# Patient Record
Sex: Male | Born: 2000 | Race: White | Hispanic: No | Marital: Single | Smoking: Never smoker
Health system: Southern US, Academic
[De-identification: ages and names within clinical notes are randomized; demographics above are authoritative.]

---
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
## 2022-06-07 ENCOUNTER — Encounter (HOSPITAL_COMMUNITY): Payer: Self-pay

## 2022-06-07 ENCOUNTER — Inpatient Hospital Stay (HOSPITAL_COMMUNITY): Admit: 2022-06-07 | Payer: BLUE CROSS/BLUE SHIELD | Admitting: Podiatrist

## 2022-06-07 SURGERY — AMPUTATION TOE
Site: Toe | Laterality: Right

## 2022-06-18 ENCOUNTER — Ambulatory Visit (HOSPITAL_COMMUNITY): Payer: BLUE CROSS/BLUE SHIELD

## 2022-06-18 ENCOUNTER — Encounter (HOSPITAL_COMMUNITY)
Admission: RE | Disposition: A | Discharge status: Home / Self Care | Payer: Self-pay | Source: Ambulatory Visit | Attending: Podiatrist

## 2022-06-18 ENCOUNTER — Other Ambulatory Visit: Payer: Self-pay

## 2022-06-18 ENCOUNTER — Encounter (HOSPITAL_COMMUNITY): Payer: Self-pay | Admitting: Podiatrist

## 2022-06-18 ENCOUNTER — Inpatient Hospital Stay
Admission: RE | Admit: 2022-06-18 | Discharge: 2022-06-18 | Disposition: A | Discharge status: Home / Self Care | Payer: BLUE CROSS/BLUE SHIELD | Source: Ambulatory Visit | Attending: Podiatrist | Admitting: Podiatrist

## 2022-06-18 DIAGNOSIS — Q699 Polydactyly, unspecified: Secondary | ICD-10-CM

## 2022-06-18 DIAGNOSIS — Q692 Accessory toe(s): Secondary | ICD-10-CM | POA: Insufficient documentation

## 2022-06-18 SURGERY — AMPUTATION TOE
Anesthesia: General | Site: Toe | Laterality: Right | Wound class: Clean Wound: Uninfected operative wounds in which no inflammation occurred

## 2022-06-18 MED ORDER — LIDOCAINE HCL 10 MG/ML (1 %) INJECTION SOLUTION
Freq: Once | INTRAMUSCULAR | Status: DC | PRN
Start: 2022-06-18 — End: 2022-06-18
  Administered 2022-06-18: 5 mL via INTRAMUSCULAR

## 2022-06-18 MED ORDER — OXYCODONE-ACETAMINOPHEN 5 MG-325 MG TABLET
1.0000 | ORAL_TABLET | ORAL | Status: DC | PRN
Start: 2022-06-18 — End: 2022-06-18
  Administered 2022-06-18: 1 via ORAL
  Filled 2022-06-18: qty 1

## 2022-06-18 MED ORDER — DEXAMETHASONE SODIUM PHOSPHATE 4 MG/ML INJECTION SOLUTION
INTRAMUSCULAR | Status: AC
Start: 2022-06-18 — End: 2022-06-18
  Filled 2022-06-18: qty 1

## 2022-06-18 MED ORDER — EPHEDRINE SULFATE 50 MG/ML INTRAVENOUS SOLUTION
Freq: Once | INTRAVENOUS | Status: DC | PRN
Start: 2022-06-18 — End: 2022-06-18
  Administered 2022-06-18: 10 mg via INTRAVENOUS

## 2022-06-18 MED ORDER — FENTANYL (PF) 50 MCG/ML INJECTION WRAPPER
INJECTION | Freq: Once | INTRAMUSCULAR | Status: DC | PRN
Start: 2022-06-18 — End: 2022-06-18
  Administered 2022-06-18 (×2): 50 ug via INTRAVENOUS

## 2022-06-18 MED ORDER — IPRATROPIUM 0.5 MG-ALBUTEROL 3 MG (2.5 MG BASE)/3 ML NEBULIZATION SOLN
3.0000 mL | INHALATION_SOLUTION | Freq: Once | RESPIRATORY_TRACT | Status: DC | PRN
Start: 2022-06-18 — End: 2022-06-18

## 2022-06-18 MED ORDER — CEFAZOLIN 1 GRAM SOLUTION FOR INJECTION
Freq: Once | INTRAMUSCULAR | Status: DC | PRN
Start: 2022-06-18 — End: 2022-06-18
  Administered 2022-06-18: 2000 mg via INTRAVENOUS

## 2022-06-18 MED ORDER — FENTANYL (PF) 50 MCG/ML INJECTION WRAPPER
12.5000 ug | INJECTION | INTRAMUSCULAR | Status: DC | PRN
Start: 2022-06-18 — End: 2022-06-18

## 2022-06-18 MED ORDER — LIDOCAINE (PF) 100 MG/5 ML (2 %) INTRAVENOUS SYRINGE
INJECTION | Freq: Once | INTRAVENOUS | Status: DC | PRN
Start: 2022-06-18 — End: 2022-06-18
  Administered 2022-06-18: 60 mg via INTRAVENOUS

## 2022-06-18 MED ORDER — FAMOTIDINE (PF) 20 MG/2 ML INTRAVENOUS SOLUTION
20.0000 mg | Freq: Once | INTRAVENOUS | Status: AC
Start: 2022-06-18 — End: 2022-06-18
  Administered 2022-06-18: 20 mg via INTRAVENOUS

## 2022-06-18 MED ORDER — SODIUM CHLORIDE 0.9 % (FLUSH) INJECTION SYRINGE
3.0000 mL | INJECTION | INTRAMUSCULAR | Status: DC | PRN
Start: 2022-06-18 — End: 2022-06-18

## 2022-06-18 MED ORDER — FENTANYL (PF) 50 MCG/ML INJECTION WRAPPER
25.0000 ug | INJECTION | INTRAMUSCULAR | Status: DC | PRN
Start: 2022-06-18 — End: 2022-06-18

## 2022-06-18 MED ORDER — LACTATED RINGERS INTRAVENOUS SOLUTION
INTRAVENOUS | Status: DC
Start: 2022-06-18 — End: 2022-06-18

## 2022-06-18 MED ORDER — FENTANYL (PF) 50 MCG/ML INJECTION WRAPPER
50.0000 ug | INJECTION | INTRAMUSCULAR | Status: DC | PRN
Start: 2022-06-18 — End: 2022-06-18

## 2022-06-18 MED ORDER — LIDOCAINE HCL 10 MG/ML (1 %) INJECTION SOLUTION
INTRAMUSCULAR | Status: AC
Start: 2022-06-18 — End: 2022-06-18
  Filled 2022-06-18: qty 20

## 2022-06-18 MED ORDER — MIDAZOLAM 5 MG/ML INJECTION WRAPPER
2.5000 mg | Freq: Once | INTRAMUSCULAR | Status: DC | PRN
Start: 2022-06-18 — End: 2022-06-18
  Administered 2022-06-18: 2.5 mg via INTRAVENOUS

## 2022-06-18 MED ORDER — SODIUM CHLORIDE 0.9 % (FLUSH) INJECTION SYRINGE
3.0000 mL | INJECTION | Freq: Three times a day (TID) | INTRAMUSCULAR | Status: DC
Start: 2022-06-18 — End: 2022-06-18

## 2022-06-18 MED ORDER — ROPIVACAINE (PF) 2 MG/ML (0.2 %) INJECTION SOLUTION
Freq: Once | INTRAMUSCULAR | Status: DC | PRN
Start: 2022-06-18 — End: 2022-06-18
  Administered 2022-06-18: 5 mL via INTRAMUSCULAR

## 2022-06-18 MED ORDER — SODIUM CHLORIDE 0.9 % INTRAVENOUS PIGGYBACK
INJECTION | INTRAVENOUS | Status: AC
Start: 2022-06-18 — End: 2022-06-18
  Filled 2022-06-18: qty 100

## 2022-06-18 MED ORDER — FAMOTIDINE (PF) 20 MG/2 ML INTRAVENOUS SOLUTION
INTRAVENOUS | Status: AC
Start: 2022-06-18 — End: 2022-06-18
  Filled 2022-06-18: qty 2

## 2022-06-18 MED ORDER — ROPIVACAINE (PF) 2 MG/ML (0.2 %) INJECTION SOLUTION
INTRAMUSCULAR | Status: AC
Start: 2022-06-18 — End: 2022-06-18
  Filled 2022-06-18: qty 10

## 2022-06-18 MED ORDER — HYDROMORPHONE 2 MG/ML INJECTION WRAPPER
0.5000 mg | INJECTION | INTRAMUSCULAR | Status: DC | PRN
Start: 2022-06-18 — End: 2022-06-18

## 2022-06-18 MED ORDER — MIDAZOLAM 5 MG/ML INJECTION WRAPPER
INTRAMUSCULAR | Status: AC
Start: 2022-06-18 — End: 2022-06-18
  Filled 2022-06-18: qty 1

## 2022-06-18 MED ORDER — DEXAMETHASONE SODIUM PHOSPHATE 4 MG/ML INJECTION SOLUTION
4.0000 mg | Freq: Once | INTRAMUSCULAR | Status: AC
Start: 2022-06-18 — End: 2022-06-18
  Administered 2022-06-18: 4 mg via INTRAVENOUS

## 2022-06-18 MED ORDER — PROPOFOL 10 MG/ML IV BOLUS
INJECTION | Freq: Once | INTRAVENOUS | Status: DC | PRN
Start: 2022-06-18 — End: 2022-06-18
  Administered 2022-06-18: 200 mg via INTRAVENOUS

## 2022-06-18 MED ORDER — ALBUTEROL SULFATE 2.5 MG/3 ML (0.083 %) SOLUTION FOR NEBULIZATION
2.5000 mg | INHALATION_SOLUTION | Freq: Once | RESPIRATORY_TRACT | Status: DC | PRN
Start: 2022-06-18 — End: 2022-06-18

## 2022-06-18 MED ORDER — ONDANSETRON HCL (PF) 4 MG/2 ML INJECTION SOLUTION
4.0000 mg | Freq: Once | INTRAMUSCULAR | Status: DC | PRN
Start: 2022-06-18 — End: 2022-06-18

## 2022-06-18 MED ORDER — HYDROMORPHONE 2 MG/ML INJECTION WRAPPER
0.2500 mg | INJECTION | INTRAMUSCULAR | Status: DC | PRN
Start: 2022-06-18 — End: 2022-06-18

## 2022-06-18 MED ORDER — ONDANSETRON HCL (PF) 4 MG/2 ML INJECTION SOLUTION
4.0000 mg | Freq: Once | INTRAMUSCULAR | Status: AC
Start: 2022-06-18 — End: 2022-06-18
  Administered 2022-06-18: 4 mg via INTRAVENOUS

## 2022-06-18 MED ORDER — ONDANSETRON HCL (PF) 4 MG/2 ML INJECTION SOLUTION
INTRAMUSCULAR | Status: AC
Start: 2022-06-18 — End: 2022-06-18
  Filled 2022-06-18: qty 2

## 2022-06-18 MED ORDER — CEFAZOLIN 1 GRAM SOLUTION FOR INJECTION
INTRAMUSCULAR | Status: AC
Start: 2022-06-18 — End: 2022-06-18
  Filled 2022-06-18: qty 20

## 2022-06-18 MED ORDER — FENTANYL (PF) 50 MCG/ML INJECTION SOLUTION
INTRAMUSCULAR | Status: AC
Start: 2022-06-18 — End: 2022-06-18
  Filled 2022-06-18: qty 2

## 2022-06-18 MED ORDER — HALOPERIDOL LACTATE 5 MG/ML INJECTION SOLUTION
1.0000 mg | Freq: Once | INTRAMUSCULAR | Status: DC | PRN
Start: 2022-06-18 — End: 2022-06-18

## 2022-06-18 SURGICAL SUPPLY — 77 items
BAG SUT DVN STRL LF (SUTURE/WOUND CLOSURE) ×1 IMPLANT
BAG SUTURE DEVON STERILE LATEX FREE (SUTURE/WOUND CLOSURE) ×1
BANDAGE 3.6YDX3.4IN 6 PLY HYPOALL LOFT LIGHT STRCH COTTON GAUZE STRL LF  DISP (WOUND CARE SUPPLY) ×1 IMPLANT
BANDAGE COFLX 5YDX4IN NONST CHSV SLF ADH FOAM COMPRESS TAN LF (WOUND CARE SUPPLY) ×1 IMPLANT
BANDAGE COFLX 5YDX4IN NONST CH_SV SLF ADH FOAM COMPRESS TAN (WOUND CARE/ENTEROSTOMAL SUPPLY) ×1
BANDAGE ESMARK NV+ 9FTX3IN STRL COMPRESS LF  DISP (WOUND CARE SUPPLY) ×1 IMPLANT
BANDAGE PRESSURE 9X3FT NOVAPLUS ESMK V1831 9 20/CS NON-LATEX (WOUND CARE/ENTEROSTOMAL SUPPLY) ×1
BANDAGE SFFRM 75X4IN STRCH FNSH EDGE ABS PLSTR RYN GAUZE (WOUND CARE/ENTEROSTOMAL SUPPLY) ×1
BANDAGE SFFRM 75X4IN STRCH FNSH EDGE ABS PLSTR RYN GAUZE STRL LF  DISP (WOUND CARE SUPPLY) ×1 IMPLANT
BLADE 10 2 END CBNSTL SURG STRL DISP (CUTTING ELEMENTS) ×1
BLADE 10 2 END CBNSTL SURG STRL DISP (SURGICAL CUTTING SUPPLIES) ×1 IMPLANT
BLADE 15 2 END CBNSTL SURG STRL DISP (CUTTING ELEMENTS) ×2
BLADE 15 2 END CBNSTL SURG STRL DISP (SURGICAL CUTTING SUPPLIES) ×2 IMPLANT
CLEANER INSTR PREPZYME MUL-TRD CONTAINR NARSL NEUT PH BDGR (MISCELLANEOUS PT CARE ITEMS) ×1
CONTAINER SP CLIKSEAL 120CC_01063 STRL 300EA/CS (MISCELLANEOUS PT CARE ITEMS) ×1
CONTAINR CLICKSEAL 4OZ TRANSLUC SCREW CAP STRL BLU SPECI PNEUM TUBE SYS (MISCELLANEOUS PT CARE ITEMS) ×1 IMPLANT
CONV USE 123874 - SYRINGE AMSURE MDCHC 60CC LF  STRL TIP PRTC SM TUBE ADPR IRRG DISC BULB POLYPROP (MED SURG SUPPLIES) ×1 IMPLANT
CONV USE 31829 - NEEDLE HYPO  18GA 1.5IN STD REG BVL LF (MED SURG SUPPLIES) ×2 IMPLANT
CONV USE ITEM 321837 - GLOVE SURG 7.5 LTX PF NONST CRM (GLOVES AND ACCESSORIES) ×1 IMPLANT
CONV USE ITEM 321983 - GLOVE SURG 8 LTX CHEMO PF SMOOTH BEAD CUF STRL WHT 11.6IN PLMR THK.2MM THK.21MM (GLOVES AND ACCESSORIES) ×1 IMPLANT
CONV USE ITEM 322852 - ROLL ECODRI-SAFE ABS POLY BCK ORTHO (ORTHOPEDICS (NOT IMPLANTS)) ×1 IMPLANT
CONV USE ITEM 323185 - PAD EG 15SQ IN UNIV FOAM SPLT NONCORD ADULT 9100 SER (SURGICAL CUTTING SUPPLIES) ×1 IMPLANT
CONV USE ITEM 329146 - CLEANER INSTR PREPZYME MUL-TRD CONTAINR NARSL NEUT PH BDGR 22OZ (MISCELLANEOUS PT CARE ITEMS) ×1 IMPLANT
CONV USE ITEM 338662 - PACK SURG ASCP STRL DISP ~~LOC~~ BPT MED CNTR LF (CUSTOM TRAYS & PACK) ×1 IMPLANT
CONV USE ITEM 34153 - ELECTRODE ESURG BLADE PNCL 3/32IN STRL SS CAUT PSHBTN STD SHAFT LF  VEGA SER (SURGICAL CUTTING SUPPLIES) ×1 IMPLANT
CONV USE ITEM 343591 - SOLIDIFY FLUID 1500CC NONST LF  PREM SOLIDIFY + (MED SURG SUPPLIES) ×1 IMPLANT
CONV USE ITEM 46133 - SUTURE 4-0 C-13 MONOSOF 18IN BLK MONOF NONAB (SUTURE/WOUND CLOSURE) ×2 IMPLANT
COUNTER 20 CNT BLOCK ADH NEEDLE STRL LF  RD SHARP FOAM 15.75X11.5X14IN DISP (MED SURG SUPPLIES) ×1 IMPLANT
COUNTER 20 CNT BLOCK ADH NEEDLE STRL LF RD SHARP FOAM 15.75 (MED SURG SUPPLIES) ×1
COVER TBL 90X50IN STD SMS REINF FNFLD STRL LF  DISP (DRAPE/PACKS/SHEETS/OR TOWEL) ×2 IMPLANT
COVER TBL 90X50IN STD SMS REINF FNFLD STRL LF DISP (DRAPE/PACKS/SHEETS/OR TOWEL) ×2
DRESS PETRO 9X5IN CURAD XR COTTON NONADH OCL IMPREGNATE LF  STRL WHT (WOUND CARE SUPPLY) ×1 IMPLANT
DRESS PETRO 9X5IN CURAD XR COT_TON NONADH OCL IMPREGNATE LF (WOUND CARE/ENTEROSTOMAL SUPPLY) ×1
DURAPREP 26ML 8630 CS/20 (MED SURG SUPPLIES) ×1
ELECTRODE ESURG BLADE PNCL 3/32IN STRL SS CAUT PSHBTN STD (CUTTING ELEMENTS) ×1
GAUZE BND ROLL 3.4IN X 3.6 YDS_MEDC (WOUND CARE/ENTEROSTOMAL SUPPLY) ×1
GLOVE SURG 7 LF  PF STRL PLISPRN DISP (GLOVES AND ACCESSORIES) ×2 IMPLANT
GLOVE SURG 7 LF PF SMOOTH STRL WHT PLISPRN (GLOVES AND ACCESSORIES) ×2
GLOVE SURG 7.5 LTX PF SMOOTH STRL CRM (GLOVES AND ACCESSORIES) ×1
GLOVE SURG 8 LTX PF SMOOTH STRL CRM (GLOVES AND ACCESSORIES) ×1
GOWN SURG LRG STD LGTH REG L3 NONREINFORCE BRTHBL TWL STRL (DRAPE/PACKS/SHEETS/OR TOWEL) ×1
GOWN SURG LRG STD LGTH REG L3 NONREINFORCE BRTHBL TWL STRL LF  DISP BLU HALYARD SPECTRUM SMS (DRAPE/PACKS/SHEETS/OR TOWEL) ×1 IMPLANT
GOWN SURG XL STD LGTH L3 NONREINFORCE HKLP CLSR TWL STRL LF (DRAPE/PACKS/SHEETS/OR TOWEL) ×1
GOWN SURG XL STD LGTH L3 NONREINFORCE HKLP CLSR TWL STRL LF  DISP BLU SPECTRUM SMS (DRAPE/PACKS/SHEETS/OR TOWEL) ×1
GOWN SURG XL STD LGTH L3 NONREINFORCE HKLP CLSR TWL STRL LF DISP BLU SPECTRUM SMS (DRAPE/PACKS/SHEETS/OR TOWEL) ×1 IMPLANT
HDPE THK22 UM C40-45 GL L48 IN X W40 IN NATURAL (MISCELLANEOUS PT CARE ITEMS) ×2 IMPLANT
NEEDLE HYPO  25GA 1.25IN STD MONOJECT AL SS REG BVL LL HUB UL SHRP ANTICORE RD STRL LF  DISP (MED SURG SUPPLIES) ×2 IMPLANT
NEEDLE HYPO 18GA 1.5IN STD RE_G BVL LF (MED SURG SUPPLIES) ×2
NEEDLE HYPO 25GA 1.25IN STD M_ONOJECT AL SS REG BVL LL HUB (MED SURG SUPPLIES) ×2
PACK ARTHROGRAM MDLN INDUSTRIES INC. RAD GEN N/M S DISP (CUSTOM TRAYS & PACK) ×1
PACK SURG ASCP STRL DISP ~~LOC~~ BPT MED CNTR LF (CUSTOM TRAYS & PACK) ×1
PAD EG 15SQ IN UNIV FOAM SPLT NONCORD ADULT 9100 SER (CUTTING ELEMENTS) ×1
ROLL ECODRI-SAFE ABS POLY BCK ORTHO (ORTHOPEDICS (NOT IMPLANTS)) ×1
SOL IRRG 0.9% NACL 1000ML PLASTIC PR BTL ISTNC N-PYRG STRL LF (MEDICATIONS/SOLUTIONS) ×1 IMPLANT
SOL SURG PREP 26ML DRPRP 74% ISPRP 0.7% IOD POVACRYLEX SLF CNTN APPL SKIN STRL PREOP (MED SURG SUPPLIES) ×1 IMPLANT
SOLIDIFY FLUID 1500CC NONST LF  PREM SOLIDIFY + (MED SURG SUPPLIES) ×1
SOLIDIFY FLUID 1500CC NONST LF PREM SOLIDIFY + (MED SURG SUPPLIES) ×1
SOLUTION IRRG NS 2F7124 1000CC_12/CS (MEDICATIONS/SOLUTIONS) ×1
SPONGE GAUZE 4X4IN MDCHC COTTON 12 PLY TY 7 LF  STRL DISP (WOUND CARE SUPPLY) ×2 IMPLANT
SPONGE GAUZE 4X4IN MDCHC COTTO_N 12 PLY TY 7 LF STRL DISP (WOUND CARE/ENTEROSTOMAL SUPPLY) ×2
STKNT ORTHO 48X4IN COTTON PLSTR 1 PLY PCUT SEWN END LF  TUB STRL OFF WHT (ORTHOPEDICS (NOT IMPLANTS)) ×1 IMPLANT
STKNT ORTHO 48X4IN COTTON PLSTR 1 PLY PCUT SEWN END LF TUB (ORTHOPEDICS (NOT IMPLANTS)) ×1
SUTURE 3-0 V-20 POLYSORB 30IN VIOL BRD COAT ABS (SUTURE/WOUND CLOSURE) ×1 IMPLANT
SUTURE 3-0 V-20 POLYSRB 30IN VIOL BRD COAT ABS (SUTURE/WOUND CLOSURE) ×2
SUTURE 4-0 C-13 MONOSOF 18IN BLK MONOF NONAB (SUTURE/WOUND CLOSURE) ×2
SYRINGE AMSURE MDCHC 60CC LF  STRL TIP PRTC SM TUBE ADPR IRRG DISC BULB POLYPROP (MED SURG SUPPLIES) ×1
SYRINGE AMSURE MDCHC 60CC LF STRL TIP PRTC SM TUBE ADPR (MED SURG SUPPLIES) ×1
SYRINGE LL 10ML LF  STRL GRAD N-PYRG DEHP-FR PVC FREE MED DISP (MED SURG SUPPLIES) ×3 IMPLANT
SYRINGE LL 10ML LF STRL MED D_ISP (MED SURG SUPPLIES) ×3
TAPE DURAPORE 2IN 6/BX 1538-2 (WOUND CARE SUPPLY) ×1 IMPLANT
TAPE DURAPORE 2IN 6/BX 1538-2 (WOUND CARE/ENTEROSTOMAL SUPPLY) ×1
TOWEL 24X16IN COTTON BLU DISP SURG STRL LF (DRAPE/PACKS/SHEETS/OR TOWEL) ×4 IMPLANT
WATER STRL 1000ML PRSV FR N-PYRG DEHP-FR PLASTIC PR BTL AQLT (MED SURG SUPPLIES) ×1
WATER STRL 1000ML PRSV FR N-PYRG DEHP-FR PLASTIC PR BTL AQLT LF (MED SURG SUPPLIES) ×1 IMPLANT
WIPE PREP PLMR ADH NONSTING DRESS ADAPT OSTOMY (MED SURG SUPPLIES) ×2 IMPLANT
WOUND IRRG IRRISEPT DBRD CLNSG 0.05% CHG SYSTEM STRL LF (WOUND CARE SUPPLY) ×1 IMPLANT
WOUND IRRG IRRISEPT DBRD CLNSG_0.05% CHG SYSTEM STRL LF (WOUND CARE/ENTEROSTOMAL SUPPLY) ×1

## 2022-06-18 NOTE — Anesthesia Preprocedure Evaluation (Signed)
ANESTHESIA PRE-OP EVALUATION  Planned Procedure: RIGHT FIFTH TOE AMPUTATION (Right: Toe)  Review of Systems     anesthesia history negative     patient summary reviewed  nursing notes reviewed        Pulmonary  negative pulmonary ROS,    Cardiovascular  negative cardio ROS,    Exercise Tolerance: > or = 4 METS        GI/Hepatic/Renal   negative GI/hepatic/renal ROS,         Endo/Other         Neuro/Psych/MS   negative neuro/psych ROS,      Cancer    negative hematology/oncology ROS,                   Physical Assessment      Airway       Mallampati: III      Mouth Opening: fair.            Dental                    Pulmonary           Cardiovascular             Other findings            Plan  ASA 2     Planned anesthesia type: general     general anesthesia with laryngeal mask airway                        Anesthetic plan and risks discussed with patient             Patient's NPO status is appropriate for Anesthesia.

## 2022-06-18 NOTE — H&P (Signed)
[facility]  H&P Update Form    Manuel Henry, Manuel Henry, 21 y.o. male  Encounter Start Date:  06/18/2022  Inpatient Admission Date:   Date of Birth:  10-26-2001    06/18/2022    STOP: IF H&P IS GREATER THAN 30 DAYS FROM SURGICAL DAY COMPLETE NEW H&P IS REQUIRED.     H & P updated the day of the procedure.  1.  H&P completed within 30 days of surgical procedure by pt's PCP  and has been reviewed within 24 hours of the surgery, the patient has been examined, and no change has occured in the patients condition since the H&P was completed.       Change in medications: No                Comments:     2.  Patient continues to be appropiate candidate for planned surgical procedure. YES      Joan Mayans, DPM

## 2022-06-18 NOTE — OR PostOp (Signed)
Patient's IV had came out at 1435. Dr. Luisa Hart in recovery room at this time and notified; verbalizes may leave IV out.

## 2022-06-18 NOTE — Anesthesia Transfer of Care (Signed)
ANESTHESIA TRANSFER OF CARE   Manuel Henry is a 21 y.o. ,male, Weight: 127 kg (280 lb)   had Procedure(s):  RIGHT FIFTH TOE AMPUTATION  performed  06/18/22   Primary Service: Joan Mayans, DPM    History reviewed. No pertinent past medical history.   Allergy History as of 06/18/22      No Known Allergies              I completed my transfer of care / handoff to the receiving personnel during which we discussed:  Access, Airway, All key/critical aspects of case discussed, Analgesia, Antibiotics, Expectation of post procedure, Fluids/Product, Gave opportunity for questions and acknowledgement of understanding, Labs and PMHx      Post Location: PACU                                                           Last OR Temp: Temperature: 36.1 C (97 F)  ABG:   Airway:* No LDAs found *  Blood pressure 111/72, pulse 72, temperature 36.1 C (97 F), resp. rate 13, height 1.88 m (6\' 2" ), weight 127 kg (280 lb), SpO2 100 %.

## 2022-06-18 NOTE — OR Surgeon (Signed)
[facility]    OPERATIVE NOTE    Patient Name: Manuel, Henry [facility] MRN:: [id number]  Date of Birth: 2001-04-23  Date of Service: 06/18/2022     Pre-Operative Diagnosis: POLYDACTYL RIGHT FOOT     Post-Operative Diagnosis: POLYDACTYL RIGHT FOOT    Procedure(s)/Description:  RIGHT FIFTH TOE AMPUTATION: 28820 (CPT)     Attending Surgeon: Leland Her, DPM     Anesthesia Staff:  Anesthesiologist: Cornett, Exie Parody, MD  CRNA: Darcel Smalling, CRNA; Judyann Munson, CRNA    Anesthesia Type: .General     Estimated Blood Loss:  Minimal    Specimens Removed:   ID Type Source Tests Collected by Time Destination   1 : EXTRA RIGHT 5TH TOE Tissue Toe SURGICAL PATHOLOGY SPECIMEN Leland Her, [place] 06/18/2022 1406       Order Name Source Comment Collection Info Order Time   SURGICAL PATHOLOGY SPECIMEN Toe Pre-op diagnosis:  POLYDACTYL RIGHT FOOT Collected By: Leland Her, DPM 06/18/2022  2:07 PM     Release to patient   Automated             Complications:  None    Condition:  Stable    Disposition:   PACU - hemodynamically stable.    Indications:  Patient is a 21 y.o. 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    Description of Procedure/Operation:  The patient was brought to the operating suite, placed supine upon the operating room table where anesthesia services provided IV sedation followed by MAC anesthesia.  The patient's RLE was then prepped and draped in usual sterile fashion in anticipation of surgery noted above.   Attention was then directed to the lateral aspect of right foot where 2 semi elliptical converging incisions were made at the base of the accessory right 5th toe.  Incisions were deepened through skin and subcutaneous tissue until the extra right 5th toe was then disarticulated from its extra joint.  This toe was then removed from surgical field and passed off as specimen for pathology.  The bony prominence noted in this location at  the head of the right 5th metatarsal was then transected and removed from surgical field as well.  Surgical site was then irrigated with copious amounts of IrriSept saline.  Capsular structures were then repaired utilizing a 3-0 Vicryl in simple suturing technique.  Subcutaneous tissue was then reapproximated utilizing a 3-0 Vicryl in simple suturing technique.  Skin was then reapproximated after being remodeled with a 4-0 nylon in simple suturing technique.  Tourniquet was then deflated and a prompt hyperemic response was then noted to the right foot and remaining digits of the right foot.  Dry sterile dressing was then applied.  The patient tolerated the procedure well and was returned to the postanesthesia care unit in stable condition after extubation in the operating suite.  Prior to closure all sponge, instrument, and needle counts were correct with excellent wound hemostasis.  Leland Her, DPM

## 2022-06-18 NOTE — OR PostOp (Signed)
Dr. Ivette Loyal at bedside speaking to patient. (1) prescription given and placed on chart.

## 2022-06-18 NOTE — Discharge Instructions (Addendum)
FOLLOW INSTRUCTIONS GIVEN BY DR. Ivette Loyal AND RETURN AS INSTRUCTED FOR DRESSING CHANGE    CALL IF INCREASED PAIN, FOUL SMELLING DRAINAGE OR A FEVER OF 100.4 OR GREATER.     USE CRUTCHES FOR BALANCE, WEAR YOUR BROWN SHOE WHEN UP WALKING    USE ICE PACK 20 MINUTES EVERY HOUR FOR 24 HOURS.    CALL THE OFFICE IF ANY QUESTIONS OR CONCERNS.     1 RX PERCOCET, TAKE AS DIRECTED    SURGICAL DISCHARGE INSTRUCTIONS     Dr. Joan Mayans, DPM  performed your RIGHT FIFTH TOE AMPUTATION today at the [facility]    [place]  [facility]:  Monday through Friday from 8 a.m. - 4 p.m.: [phone]    For T&D: [phone]  Between 4 p.m. - 8 a.m., weekends and holidays:  Call ER [phone]    PLEASE SEE WRITTEN HANDOUTS AS DISCUSSED BY YOUR NURSE:      SIGNS AND SYMPTOMS OF A WOUND / INCISION INFECTION   Be sure to watch for the following:  Increase in redness or red streaks near or around the wound or incision.  Increase in pain that is intense or severe and cannot be relieved by the pain medication that your doctor has given you.  Increase in swelling that cannot be relieved by elevation of a body part, or by applying ice, if permitted.  Increase in drainage, or if yellow / green in color and smells bad. This could be on a dressing or a cast.  Increase in fever for longer than 24 hours, or an increase that is higher than 101 degrees Fahrenheit (normal body temperature is 98 degrees Fahrenheit). The incision may feel warm to the touch.    **CALL YOUR DOCTOR IF ONE OR MORE OF THESE SIGNS / SYMPTOMS SHOULD OCCUR.    ANESTHESIA INFORMATION   ANESTHESIA -- ADULT PATIENTS:  You have received intravenous sedation / general anesthesia, and you may feel drowsy and light-headed for several hours. You may even experience some forgetfulness of the procedure. DO NOT DRIVE A MOTOR VEHICLE or perform any activity requiring complete alertness or coordination until you feel fully awake in about 24-48 hours. Do not  drink alcoholic beverages for at least 24 hours. Do not stay alone, you must have a responsible adult available to be with you. You may also experience a dry mouth or nausea for 24 hours. This is a normal side effect and will disappear as the effects of the medication wear off.    REMEMBER   If you experience any difficulty breathing, chest pain, bleeding that you feel is excessive, persistent nausea or vomiting or for any other concerns:  Call your physician Dr.  Joan Mayans, DPM . You may also ask to have the general doctor on call paged. They are available to you 24 hours a day.      SPECIAL INSTRUCTIONS / COMMENTS       FOLLOW-UP APPOINTMENTS   Please call your surgeon's office at the number listed to schedule a date / time of return for follow-up.     Dr Darrold Junker [phone]

## 2022-06-18 NOTE — Anesthesia Postprocedure Evaluation (Signed)
Anesthesia Post Op Evaluation    Patient: Manuel Henry  Procedure(s):  RIGHT FIFTH TOE AMPUTATION    Last Vitals:Temperature: 36.1 C (97 F) (06/18/22 1420)  Heart Rate: 82 (06/18/22 1435)  BP (Non-Invasive): (!) 120/92 (06/18/22 1435)  Respiratory Rate: 15 (06/18/22 1435)  SpO2: 100 % (06/18/22 1435)    No notable events documented.    Patient is sufficiently recovered from the effects of anesthesia to participate in the evaluation and has returned to their pre-procedure level.  Patient location during evaluation: PACU       Patient participation: complete - patient participated  Level of consciousness: awake and alert and responsive to verbal stimuli    Pain management: adequate  Airway patency: patent    Anesthetic complications: no  Cardiovascular status: acceptable  Respiratory status: acceptable  Hydration status: acceptable  Patient post-procedure temperature: Pt Normothermic   PONV Status: Absent

## 2022-06-24 DIAGNOSIS — S98131A Complete traumatic amputation of one right lesser toe, initial encounter: Secondary | ICD-10-CM

## 2022-06-24 LAB — SURGICAL PATHOLOGY SPECIMEN

## 2022-09-07 ENCOUNTER — Other Ambulatory Visit: Payer: Self-pay

## 2022-09-07 ENCOUNTER — Emergency Department
Admission: EM | Admit: 2022-09-07 | Discharge: 2022-09-07 | Disposition: A | Discharge status: Home / Self Care | Payer: BLUE CROSS/BLUE SHIELD | Attending: Emergency Medicine | Admitting: Emergency Medicine

## 2022-09-07 ENCOUNTER — Encounter (HOSPITAL_BASED_OUTPATIENT_CLINIC_OR_DEPARTMENT_OTHER): Payer: Self-pay

## 2022-09-07 ENCOUNTER — Emergency Department (HOSPITAL_BASED_OUTPATIENT_CLINIC_OR_DEPARTMENT_OTHER): Payer: BLUE CROSS/BLUE SHIELD

## 2022-09-07 DIAGNOSIS — Y92169 Unspecified place in school dormitory as the place of occurrence of the external cause: Secondary | ICD-10-CM

## 2022-09-07 DIAGNOSIS — Z720 Tobacco use: Secondary | ICD-10-CM | POA: Insufficient documentation

## 2022-09-07 DIAGNOSIS — T59811A Toxic effect of smoke, accidental (unintentional), initial encounter: Secondary | ICD-10-CM | POA: Insufficient documentation
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
## 2022-09-07 NOTE — ED Nurses Note (Signed)
Verbalized understanding of discharge instructions and follow up information. AVS given to patient.  VSS.  Left ER with no further complaints.
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
## 2023-11-14 IMAGING — MR MRI WRIST LT W/O CONTRAST
5 of 6 series · 26 of 40 positions shown · IV contrast (gadolinium)
Comparison: Outside x-ray dated 11/01/2023.

﻿EXAM:  64662   MRI WRIST LT W/O CONTRAST
INDICATION: 22-year-old sustained football injury recently.  Radiographs of the wrist show subtle lucency through the scaphoid suspicious of fracture.  No prior history of surgery.
TECHNIQUE: Multiplanar, multisequential MRI of the left wrist was performed without gadolinium contrast.

[Series 6: T1 · axial · left · 2.5mm · 0.34mm/px · z∈[-23,+44]mm · 7 of 23 slices shown (1 of 3)]
[im 1/23]
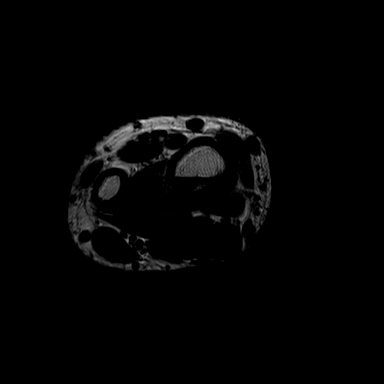
[im 4/23]
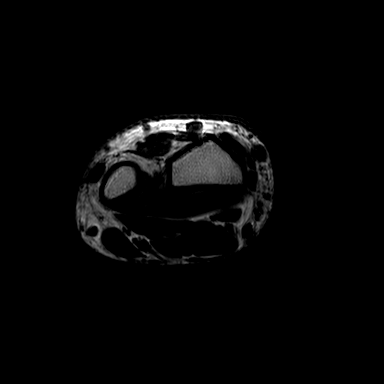
[im 8/23]
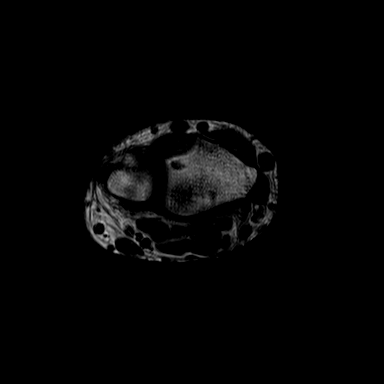
[im 12/23]
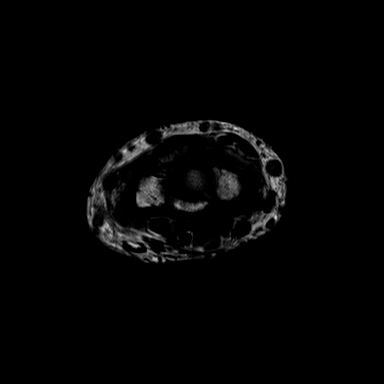
[im 15/23]
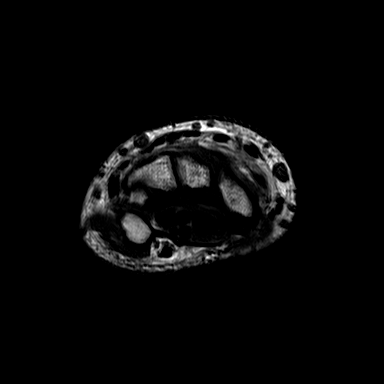
[im 19/23]
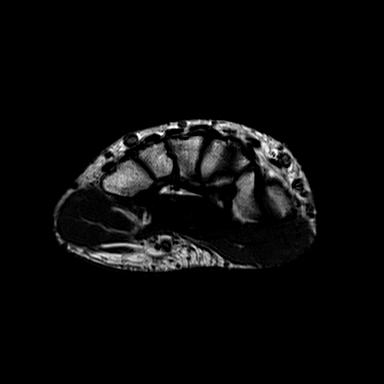
[im 23/23]
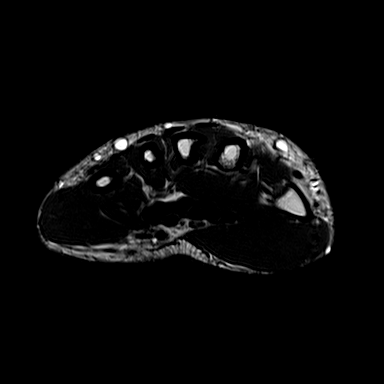

[Series 8: T1 · coronal · left · 3.5mm · 0.25mm/px · 5 of 17 slices shown (2 of 3)]
[im 1/17]
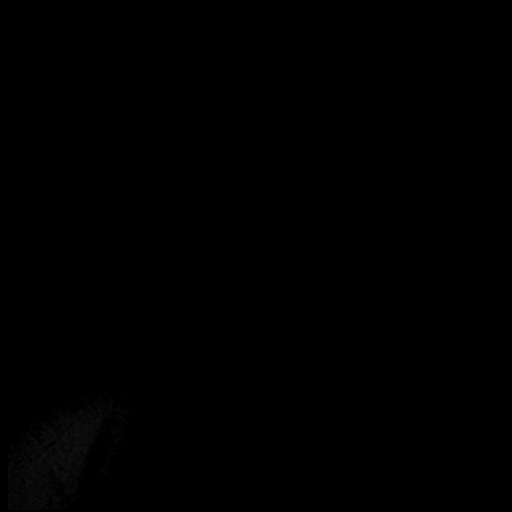
[im 5/17]
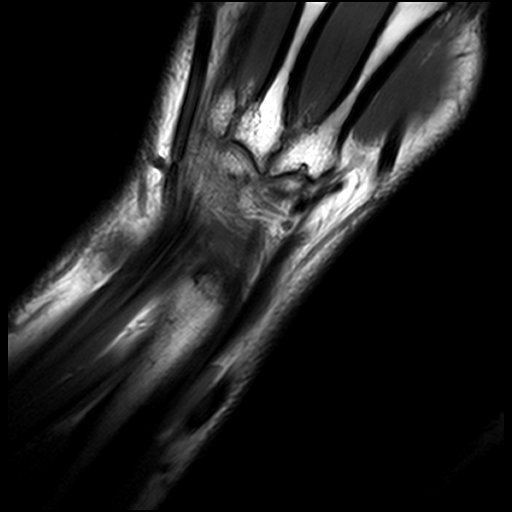
[im 9/17]
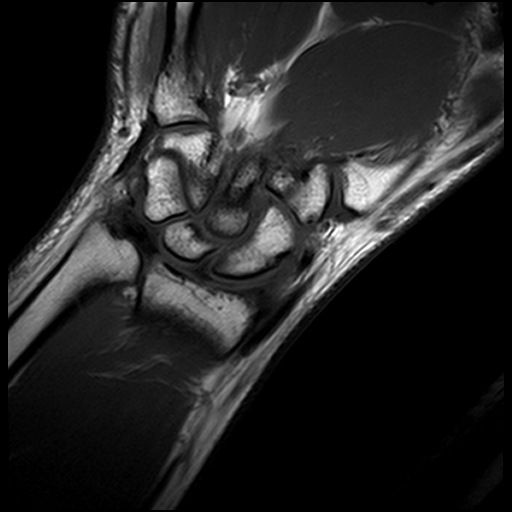
[im 13/17]
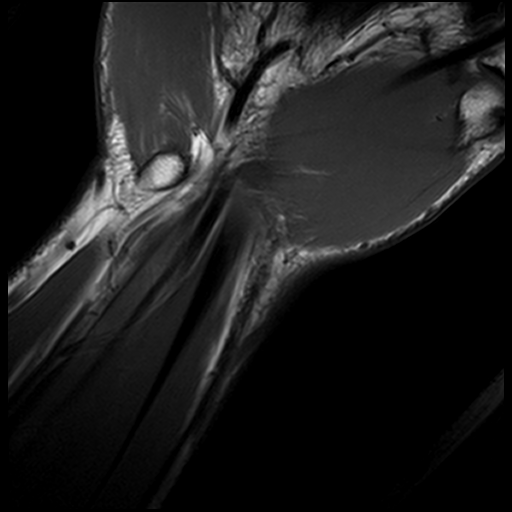
[im 17/17]
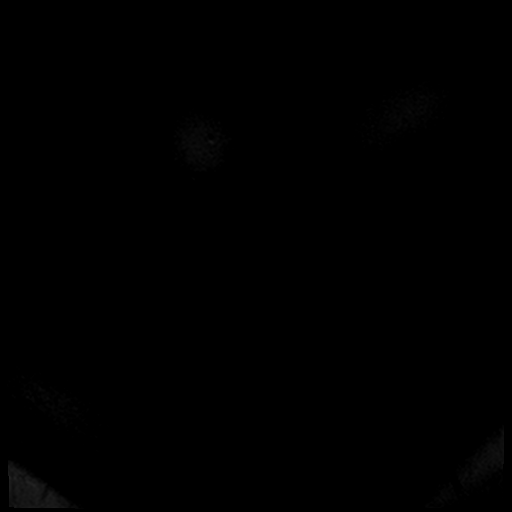

[Series 9: PD fat-sat · coronal · left · 3.5mm · 0.29mm/px · 5 of 17 slices shown]
[im 1/17]
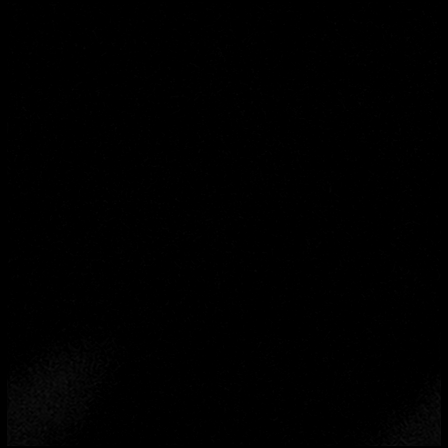
[im 5/17]
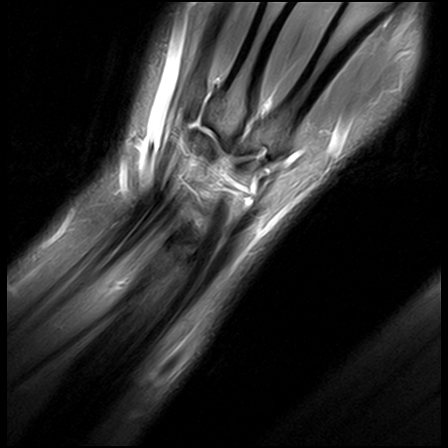
[im 9/17]
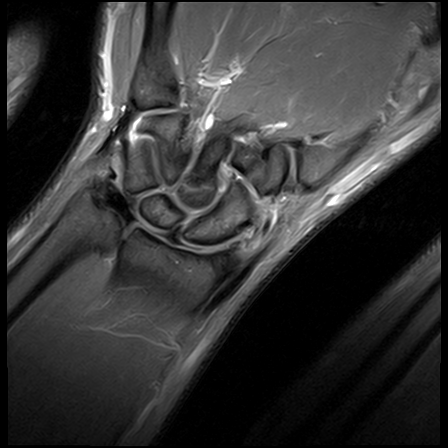
[im 13/17]
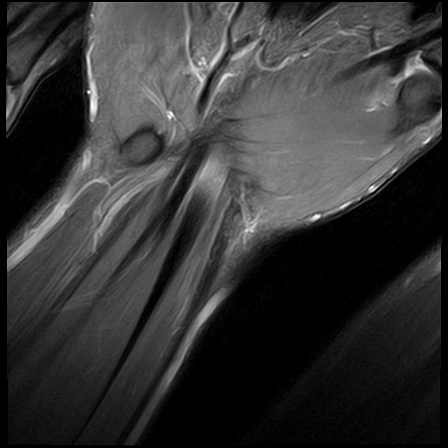
[im 17/17]
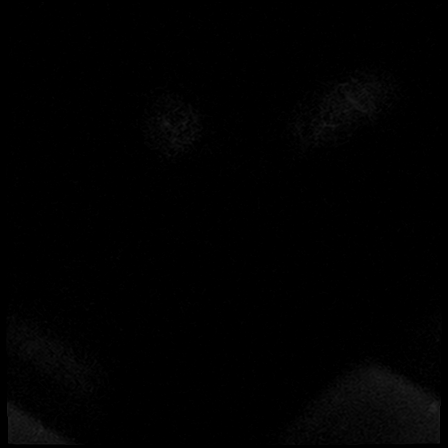

[Series 10: T1 · sagittal · left · 3.5mm · 0.27mm/px · 2 of 29 slices shown (3 of 3)]
[im 1/29]
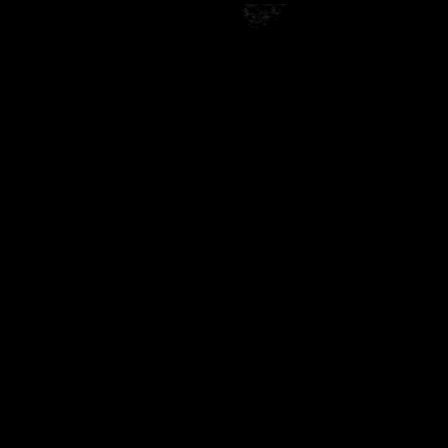
[im 5/29]
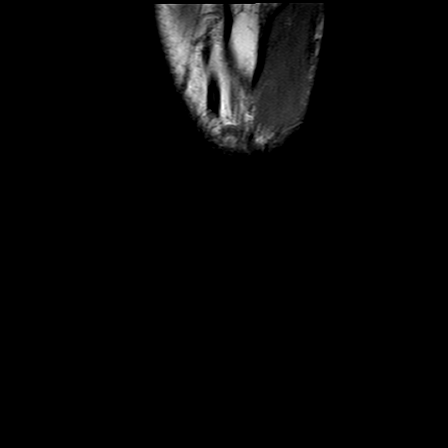

[Series 12: PD · axial · left · 2.5mm · 0.29mm/px · z∈[-23,+44]mm · 7 of 23 slices shown]
[im 1/23]
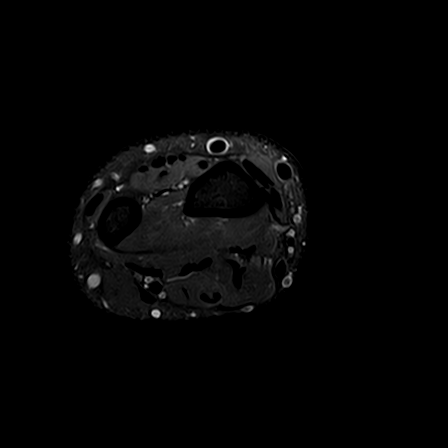
[im 4/23]
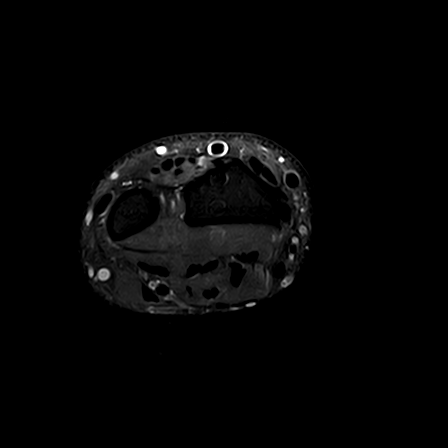
[im 8/23]
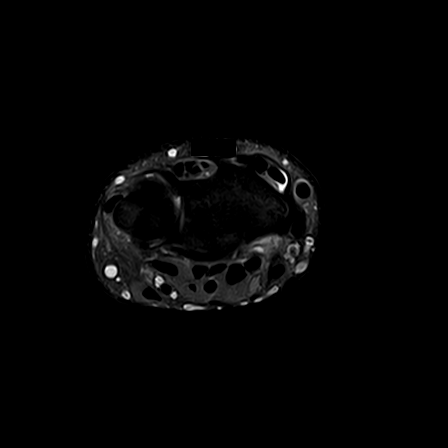
[im 12/23]
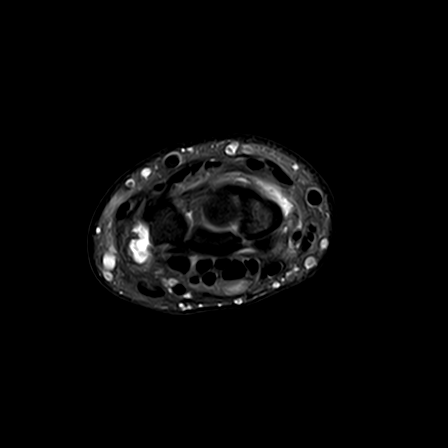
[im 15/23]
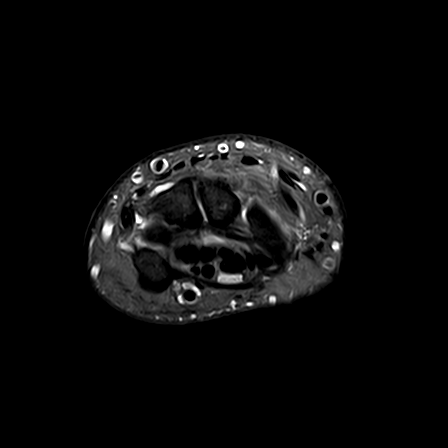
[im 19/23]
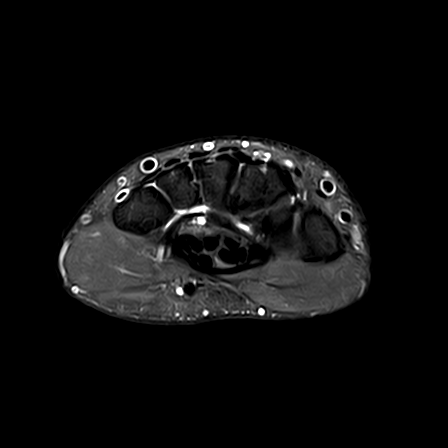
[im 23/23]
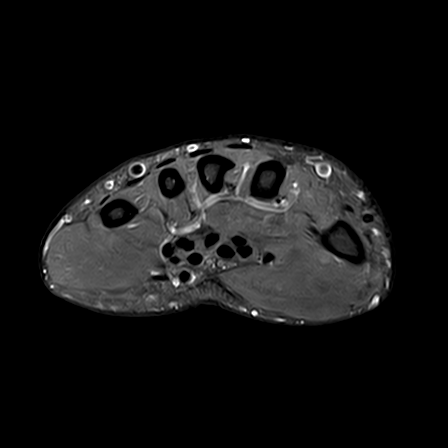

[26 of 40 positions shown; findings below may reference images not displayed]

FINDINGS: No acute fracture or bone marrow edema of the scaphoid bone is seen. Minimal bone marrow edema of the lunate bone is noted.  No fracture line is seen.

Intercarpal ligaments are intact.  The triangular fibrocartilage is intact.  Carpal tunnel structures are intact.  Small effusion is noted in the distal radioulnar joint.
IMPRESSION: 1. No evidence of acute fracture or bone marrow edema of the scaphoid bone.

2. Minimal bone marrow edema of the lunate bone.  No fracture line.

3. Intercarpal ligaments are intact.  Distal radioulnar ligaments are intact. Triangular fibrocartilage is intact.
